# Patient Record
Sex: Female | Born: 1995 | Race: White | Hispanic: No | Marital: Single | Smoking: Never smoker
Health system: Southern US, Community
[De-identification: ages and names within clinical notes are randomized; demographics above are authoritative.]

## PROBLEM LIST (undated history)

## (undated) DIAGNOSIS — L709 Acne, unspecified: Secondary | ICD-10-CM

## (undated) DIAGNOSIS — E348 Other specified endocrine disorders: Secondary | ICD-10-CM

## (undated) HISTORY — DX: Acne, unspecified: L70.9

## (undated) HISTORY — DX: Other specified endocrine disorders: E34.8

---
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
## 2014-02-12 ENCOUNTER — Telehealth: Payer: Self-pay | Admitting: Family Medicine

## 2014-02-12 NOTE — Telephone Encounter (Signed)
noted 

## 2014-02-12 NOTE — Telephone Encounter (Signed)
Patient Information:  Caller Name: Megan Andersen  Phone: [phone]  Patient: Megan Andersen, Megan Andersen  Gender: Female  DOB: [birth year]  Age: 18 Years  PCP: Megan Andersen, Megan Andersen [facility](Family Practice)  Pregnant: No  Office Follow Up:  Does the office need to follow up with this patient?: Yes  Instructions For The Office: Patient with constipation issues. Also eating issues.  No appt available. Last BM 4 days ago. Please review and advise. Currently on Miralax BID since 02/04/14  RN Note:  No appt available. Please contact Mother for assistance with being seen. Megan Andersen  [phone].  Mother [phone]  Symptoms  Reason For Call & Symptoms: Mother Megan Andersen is calling in about Megan Andersen. She is recently home from Megan Andersen. (1) Mother states Megan Andersen is constipated . she has been taking Miralax Bid since 02/04/14.   Last BM Sunday 02/08/14 "but not a good BM- hard and small". Drinking water. Hx of constipation but never been treated. +bloating.   (2)  Possible eating disorder . She has discussed with mother and they did an online test "to check to see if you have an eating diorder". No anorexic or bulimic. "Binge and withholding food, regimented.  She is on phentermine and metformin, with no weight loss.  Reviewed Health History In EMR: Yes  Reviewed Medications In EMR: Yes  Reviewed Allergies In EMR: Yes  Reviewed Surgeries / Procedures: Yes  Date of Onset of Symptoms: 02/04/2014  Treatments Tried: Miralax  Treatments Tried Worked: No OB / GYN:  LMP: Unknown  Guideline(s) Used:  Constipation  Disposition Per Guideline:   See Today in Office  Reason For Disposition Reached:   Last bowel movement (BM) > 4 days ago  Advice Given:  General Constipation Instructions:  Eat a high fiber diet.  Drink adequate liquids.  Exercise regularly (even a daily 15 minute walk!).  Liquids:  Drink 6-8 glasses of water a day (Caution: certain medical conditions require fluid restriction).  Prune juice is a natural laxative.  Avoid alcohol.  High Fiber Diet:  Try to eat fresh fruit and vegetables at each meal (peas, prunes, citrus, apples, beans, corn).  Eat more grain foods (bran flakes, bran muffins, graham crackers, oatmeal, brown rice, and whole wheat bread). Popcorn is a source of fiber.  Osmotic Laxatives:  Miralax (polyethylene glycol 3350): Miralax is an "osmotic" agent which means that it binds water and causes water to be retained within the stool. You can use this laxative to treat occasional constipation. Do not use for more than 2 weeks without approval from your doctor. Generally, Miralax produces a bowel movement in 1 to 3 days. Side effects include diarrhea (especially at higher doses). If you are pregnant, discuss with your doctor before using. Available in the [place].  Call Back If:  Constipation continues (i.e., less than 3 BMs / week or straining more than 25% of the time) after following care advice for constipation for 2 weeks  You become worse  Patient Will Follow Care Advice:  YES

## 2014-03-26 ENCOUNTER — Telehealth: Payer: Self-pay | Admitting: Gastroenterology

## 2014-03-26 NOTE — Telephone Encounter (Signed)
I spoke with her mother , Mel AlmondBeth Eangle CRNA at [employer], about helping to arrange new GI appt for constipation, abdominal pains.  Patty, Can you call her on her cell, for new GI appt with any provider in next 2-3 weeks.  Extender is Ok per mom, patient.  Thanks

## 2014-03-26 NOTE — Telephone Encounter (Signed)
04/16/14 830 am Willette ClusterPaula Guenther appt pt is aware

## 2014-04-16 ENCOUNTER — Encounter: Payer: Self-pay | Admitting: Nurse Practitioner

## 2014-04-16 ENCOUNTER — Ambulatory Visit (INDEPENDENT_AMBULATORY_CARE_PROVIDER_SITE_OTHER): Payer: 59 | Admitting: Nurse Practitioner

## 2014-04-16 ENCOUNTER — Telehealth: Payer: Self-pay | Admitting: Family Medicine

## 2014-04-16 VITALS — BP 106/64 | HR 76 | Ht 66.0 in | Wt 177.0 lb

## 2014-04-16 DIAGNOSIS — R635 Abnormal weight gain: Secondary | ICD-10-CM

## 2014-04-16 DIAGNOSIS — R5381 Other malaise: Secondary | ICD-10-CM

## 2014-04-16 DIAGNOSIS — K5900 Constipation, unspecified: Secondary | ICD-10-CM

## 2014-04-16 DIAGNOSIS — R5383 Other fatigue: Secondary | ICD-10-CM

## 2014-04-16 NOTE — Telephone Encounter (Signed)
Referral placed.

## 2014-04-16 NOTE — Patient Instructions (Signed)
Please purchase Miralax over the counter and take as directed twice daily.  If this does not work please call the office back and we can send Linzess in for you to try.

## 2014-04-16 NOTE — Telephone Encounter (Signed)
Pt's mom is requesting a referral to a endocrinologist for pt. Pt still having weight gain/fatigue.

## 2014-04-16 NOTE — Progress Notes (Signed)
HPI :  Patient is a 18 year old female, accompanied by her mother, here for evaluation of constipation. Problem started approximately 2 years ago. She defines constipation has decreased frequency of stool. Patient averages one bowel movement every 4-6 days and her stools are hard. No rectal bleeding. Patient tried twice daily MiraLax for several days, it did work after one week. After reading the package insert discouraging prolonged use of MiraLax patient discontinued it. She has now been taking 3 magnesium tablets daily. Magnesium worked well for the first 3 weeks that her bowels are slowed back down this week. Patient gets right lower quadrant pain if she eats nuts/seeds. Pain lasted 30 minutes to an hour, unrelieved with defecation.  Patient and mother significantly concerned about excessive weight gain. Patient has gained 35 pounds over the last year, the majority of the weight has been in the last 9 or so months. Patient was started on metformin in December for possible polycystic ovary disease. She was seen by gynecology in March she did not think PCOS was likely, metformin was discontinued. Patient has also tried phentermine which actually caused weight gain. Patient is very frustrated as continues to gain weight despite exercise and small portions. Apparently thyroid labs were normal.   Past Medical History  Diagnosis Date  . Acne   . Pineal gland cyst     Family History  Problem Relation Age of Onset  . Hypertension Father   . Idiopathic pulmonary fibrosis Maternal Grandmother   . Hyperlipidemia Maternal Grandfather   . Diabetes Maternal Grandfather   . Kidney disease Maternal Grandfather   . Cancer Paternal Grandmother   . Hypertension Paternal Grandmother   . Atrial fibrillation Paternal Grandmother   . Hypertension Paternal Grandfather    History  Substance Use Topics  . Smoking status: Never Smoker   . Smokeless tobacco: Not on file  . Alcohol Use: No   Current  Outpatient Prescriptions  Medication Sig Dispense Refill  . DIGESTIVE ENZYMES PO Take 1 capsule by mouth. daily      . Magnesium 400 MG CAPS Take 3 capsules by mouth. At bedtime      . Oreg-Peppermint-Thyme-Gldnseal (YEAST FORMULA PO) Take 5 capsules by mouth. Take 5 caps at bedtime once a week      . Probiotic Product (PROBIOTIC DAILY PO) Take 2 capsules by mouth. daily       No current facility-administered medications for this visit.   No Known Allergies   Review of Systems: All systems reviewed and negative except where noted in HPI.    Physical Exam: BP 106/64  Pulse 76  Ht 5\' 6"  (1.676 m)  Wt 177 lb (80.287 kg)  BMI 28.58 kg/m2  LMP 03/21/2014 Constitutional: Pleasant,well-developed, white female in no acute distress. HEENT: Normocephalic and atraumatic. Conjunctivae are normal. No scleral icterus. Neck supple.  Cardiovascular: Normal rate, regular rhythm.  Pulmonary/chest: Effort normal and breath sounds normal. No wheezing, rales or rhonchi. Abdominal: Soft, nondistended, nontender. Bowel sounds active throughout. There are no masses palpable. No hepatomegaly. Extremities: no edema Lymphadenopathy: No cervical adenopathy noted. Neurological: Alert and oriented to person place and time. Skin: Skin is warm and dry. No rashes noted. Psychiatric: Normal mood and affect. Behavior is normal.   ASSESSMENT AND PLAN:  56. 18 year old female with two year history of constipation described as decreased frequency of defecation and hard stools). She has no alarm features such as weight loss, rectal bleeding. MiraLax was helpful but patient discontinued it  after package insert cautioned about prolonged use. Long discussion with patient mother regarding treatment of constipation. Obviously we would rather patient not require medication on a daily basis but it sounds like she has tried diet, exercise and increase fluid intake without much success. I would rather her use daily MiraLax  then taking magnesium tablets or stimulants. Should MiraLax lose its efficacy patient will let us know at which time we may consider Linzess. Patient will also let us know if she has any rectal bleeding or other GI symptoms as this would require further workup.  2. unexplained, significant weight gain over the last 2 years. This is in the face of exercise and healthy food choices with portion control. Unfortunately I did not have a whole lot to offer patient. Thyroid studies reportedly normal though I suppose there could be some other endocrine component of weight gain.

## 2014-04-17 DIAGNOSIS — K5900 Constipation, unspecified: Secondary | ICD-10-CM | POA: Insufficient documentation

## 2014-04-18 NOTE — Progress Notes (Signed)
Reviewed and agree with management plan.  Metha Kolasa T. Ayen Viviano, MD FACG 

## 2014-04-27 ENCOUNTER — Encounter: Payer: 59 | Attending: Family Medicine | Admitting: Dietician

## 2014-04-27 ENCOUNTER — Encounter: Payer: Self-pay | Admitting: Dietician

## 2014-04-27 VITALS — Ht 65.0 in | Wt 182.0 lb

## 2014-04-27 DIAGNOSIS — Z713 Dietary counseling and surveillance: Secondary | ICD-10-CM | POA: Diagnosis not present

## 2014-04-27 DIAGNOSIS — E669 Obesity, unspecified: Secondary | ICD-10-CM | POA: Diagnosis present

## 2014-04-27 DIAGNOSIS — R635 Abnormal weight gain: Secondary | ICD-10-CM

## 2014-04-27 NOTE — Patient Instructions (Addendum)
Follow up Dr. Elvera LennoxGherghe. Continue following mindful eating practices. Try to stay positive about what your body is capable doing. Think about talking someone about anxiety/tension.

## 2014-04-27 NOTE — Progress Notes (Signed)
  Medical Nutrition Therapy:  Appt start time: 1500 end time:  1545.  Assessment:  Primary concerns today: Megan Andersen is here today since she is frustrated her weight has been "yo-yo-ing" for the past three years. Has been watching what she eats and exercising and weight goes between 150-180 lbs. States that she was always "heavy" and lost a bit of weight/grew taller in middle school. Starting noticing an increase in weight 3 years ago.   Has been checked for thyroid issues and PCOS, both of which have been ruled out so far. Going to see Dr. Elvera LennoxGherghe next week to check on hormones.   Trying to eat "clean", high protein, avoids carbs, and gluten. Avoids excess sugar.  [place] and living [place] during the summer. Had a kitchenette at school so was able to prepare a lot of her own but still did go to the dining hall. Was under a lot of stress in college and still has some "tension" but she is not sure why. Sleeps about 7-8 hours per night.  Has been at current weight since November. States that mom's side of the family has large muscular, legs, and "hippy". Mom was athletic and gained some weight in her 30's but was always in the normal range.   Rarely feels too hungry, overall satisfied by foods she eats. Trying to listen to her body and eat when she is hungry. Eats protein the size of the palm of her hand, doesn't limit vegetables, eats about 1 cup of fruit. She does not drink alcohol. Drinks a lot of water. Does not skip meals.  Has used Phentermine and lost weight in the past, though she said that it didn't work the last time she tried it. Has read a lot about mindful eating and tries to eat mindfully. Would feel comfortable at 150-155 lbs.   Preferred Learning Style:   No preference indicated   Learning Readiness:   Ready  MEDICATIONS: Magnesium occasionally - d/t constipation     DIETARY INTAKE:  Avoided foods include gluten, sugar, raw nuts, corn, soy.    24-hr recall:  B  ( AM): protein shake (spinach, chocolate protein powder, berries) or egg whites with Canadian bacon  Snk ( AM): none L ( PM): vegetables - pepper, cucumbers, tomatoes, celery, carrots with hummus and chicken breast or chicken sausage Snk ( PM): sometimes will have fruit (watermelon) apple with almond butter  D ( PM): salmon, chicken, or pork with green beans, broccoli, or asparagus  Snk ( PM): none Beverages: water or green tea  Usual physical activity: running 20-30 minutes 3 x week, strength training/pilates/yoga 3 x week   Estimated energy needs: 2000 calories 225 g carbohydrates 150 g protein 56 g fat  Progress Towards Goal(s):  In progress.   Nutritional Diagnosis:  Megan Andersen-3.3 Overweight/obesity As related to frequent stress and unknown reasons for weight gain.  As evidenced by BMI of 30.3.    Intervention:  Nutrition counseling provided. Plan: Follow up Dr. Elvera LennoxGherghe. Continue following mindful eating practices. Try to stay positive about what your body is capable doing. Think about talking someone about anxiety/tension.   Teaching Method Utilized:  Visual Auditory Hands on  Barriers to learning/adherence to lifestyle change: none  Demonstrated degree of understanding via:  Teach Back   Monitoring/Evaluation:  Dietary intake, exercise, and body weight in 1-2 week(s).

## 2014-05-04 ENCOUNTER — Encounter: Payer: Self-pay | Admitting: Internal Medicine

## 2014-05-04 ENCOUNTER — Ambulatory Visit (INDEPENDENT_AMBULATORY_CARE_PROVIDER_SITE_OTHER): Payer: 59 | Admitting: Internal Medicine

## 2014-05-04 VITALS — BP 112/62 | HR 89 | Temp 98.4°F | Resp 12 | Ht 65.25 in | Wt 182.0 lb

## 2014-05-04 DIAGNOSIS — R635 Abnormal weight gain: Secondary | ICD-10-CM

## 2014-05-04 DIAGNOSIS — N926 Irregular menstruation, unspecified: Secondary | ICD-10-CM

## 2014-05-04 NOTE — Patient Instructions (Signed)
Please come back in the 3rd day of your menses for labs. If you can, come at 8 am, fasting. We will schedule a new appt if the labs are abnormal.

## 2014-05-04 NOTE — Progress Notes (Signed)
Patient ID: Megan Andersen, female   DOB: 1996/04/09, 18 y.o.   MRN: [id number]  HPI: Megan Andersen is a 18 y.o. female, referred by Dr Tawanna Coolerodd, in consultation for weight gain.   Pt describes she gained 40 lbs. Her weight gain started in 2012 >> started dieting and exercise >> lost some weight >> then gained it back. She feels she gained 30 lbs since last year, w/o changing  exercise and diet but she started college then.  She was previously tested for PCOS and thyroid ds and these were negative.  Weight gain: - no steroid use - Meals: - Breakfast: protein shake  - plant-based protein, spinach, berries - Lunch: meat + salad or veggies; sometimes humus; sometimes sweet potatoes - Dinner: same as lunch - Snacks: veggies + humus, fruit, almond butter - Diets tried: fast metabolism diet - for 1.5 years: eating whole foods, no corn, no soy, no gluten, avoids sugars; high protein, high veggies, sometimes fruit. Drinks water and green tea. - she tried phentermine: started Spring 2014 >> lost 20 lbs on it; and Spring 2015 >> gained 10 lbs on it! - Exercise: running - 3-4x a week, walks x 1 h every day with her dogs; was doing strength training  Fertility/Menstrual cycles: - had a h/o irregular menses; sometimes they are late; last year and the year before - had 3 mo w/o a cycle; after doing Atkins diet  - were taking OCPs >> gained 10 lbs >> 5 mo - overall she did not like being on OCP - she started NuvaRing >> mood swings >> 3 mo - she took it out - no h/o ovarian cysts - children: 0 - miscarriages: 0 - contraception: abstinence  Acne: - increased after puberty - tried Accutane 6 months >> cleared up   Hirsutism: - no  Treatments tried: - did not try Metformin - did not try Spironolactone - did not try [place] - not on OCPs  Other meds: - SSRIs: no  - Last thyroid tests: Lab Results  Component Value Date   TSH 0.79 09/18/2013   Pt has FH of obesity in uncle. FH of DM in MGF,  uncle. No h/o fertility pbs.   ROS: Constitutional: + weight gain, no fatigue, no subjective hyperthermia/hypothermia Eyes: no blurry vision, no xerophthalmia ENT: no sore throat, no nodules palpated in throat, no dysphagia/odynophagia, no hoarseness Cardiovascular: no CP/SOB/palpitations/leg swelling Respiratory: no cough/SOB Gastrointestinal: no N/V/D/C Musculoskeletal: no muscle/joint aches Skin: no acne, no hair on face Neurological: no tremors/numbness/tingling/dizziness Psychiatric: no depression/anxiety  Past Medical History  Diagnosis Date  . Acne   . Pineal gland cyst    History reviewed. No pertinent past surgical history. History   Social History  . Marital Status: Single    Spouse Name: N/A    Number of Children: 0   Occupational History  . student   Social History Main Topics  . Smoking status: Never Smoker   . Smokeless tobacco: Not on file  . Alcohol Use: No  . Drug Use: No   Current Outpatient Prescriptions on File Prior to Visit  Medication Sig Dispense Refill  . DIGESTIVE ENZYMES PO Take 1 capsule by mouth. daily      . Magnesium 400 MG CAPS Take 3 capsules by mouth. At bedtime      . Oreg-Peppermint-Thyme-Gldnseal (YEAST FORMULA PO) Take 5 capsules by mouth. Take 5 caps at bedtime once a week      . Probiotic Product (PROBIOTIC DAILY PO) Take 2 capsules  by mouth. daily       No current facility-administered medications on file prior to visit.   Allergies  Allergen Reactions  . Gluten Meal    Family History  Problem Relation Age of Onset  . Hypertension Father   . Idiopathic pulmonary fibrosis Maternal Grandmother   . Hyperlipidemia Maternal Grandfather   . Diabetes Maternal Grandfather   . Kidney disease Maternal Grandfather   . Cancer Paternal Grandmother   . Hypertension Paternal Grandmother   . Atrial fibrillation Paternal Grandmother   . Hypertension Paternal Grandfather    PE: BP 112/62  Pulse 89  Temp(Src) 98.4 F (36.9 C)  (Oral)  Resp 12  Ht 5' 5.25" (1.657 m)  Wt 182 lb (82.555 kg)  BMI 30.07 kg/m2  SpO2 99%  LMP 04/26/2014 Wt Readings from Last 3 Encounters:  05/04/14 182 lb (82.555 kg) (95%*, Z = 1.65)  04/27/14 182 lb (82.555 kg) (95%*, Z = 1.65)  04/16/14 177 lb (80.287 kg) (94%*, Z = 1.55)   * Growth percentiles are based on CDC 2-20 Years data.   Constitutional: overweight, in NAD, no full supraclavicular fat pads Eyes: PERRLA, EOMI, no exophthalmos ENT: moist mucous membranes, no thyromegaly, no cervical lymphadenopathy Cardiovascular: RRR, No MRG Respiratory: CTA B Gastrointestinal: abdomen soft, NT, ND, BS+ Musculoskeletal: no deformities, strength intact in all 4 Skin: moist, warm; no acne on face, no dark terminal hair on chin, no vellum on sideburns, no skin tags, no acanthosis nigricans, no purple, wide, stretch marks Neurological: no tremor with outstretched hands, DTR normal in all 4  ASSESSMENT: 1. Weight gain  2. Irregular menses  PLAN: 1. Weight gain - we discussed about endocrine causes for weight loss: I had a long discussion with the pt reviewing possible causes of weight loss (including endocrine): - Discussed diet in detail, reviewing each of her food choices and she appears to be eating healthy for >1 year - She is not drinking sodas - she eats a low glycemic index foods. Avoids highly-processed sugars.  - she is getting apparently adequate exercise daily - no meds that can increase her weight (steroids, etc) - we discussed about endocrine causes for obesity, to include:   hypothyroidism (thyroid test were normal 8 mo ago >> will recheck)  menopause (doubt, will check LH, FSH, estrogen)  PCOS (possibly)  Cushing disease (no features of Cushing sd. and she could actually lose 30 lbs recently) - since she is overweight and has a FH of DM, will check a HbA1c 1. And 2. I discussed with the patient about the fact that the PCOS is a misnomer, a patient does not  necessarily have to have polycystic ovaries to be diagnosed with the disorder. This is of sum of several conditions, including:  weight gain  insulin resistance (and therefore a higher risk of developing diabetes later in life)  acne  hirsutism  irregular menstrual cycles  decreased fertility. - We also discussed about the fact that the treatment is usually targeted to addressing the problem that concerns the patient the most: acne/hirsutism, weight gain, or fertility, but there is no single treatment for PCOS.  - The first-line therapy are oral contraceptives (she would like to avoid these). If she is concerned with her weight, we can use metformin (she agrees with this); if she is concerned about acne/hirsutism, we can add spironolactone (the acne resolved as mentioned in the HPI); and if she is concerned about fertility, I could refer her to reproductive endocrinology for possible  use of clomiphene (no plans for pregnancy now) - I will see her back if the labs are abnormal >> we will check: Orders Placed This Encounter  Procedures  . TSH  . T4, free  . T3, free  . LH  . FSH  . Estradiol  . Testosterone, free, total  . HgB A1c  She is now close to ovulation so she will return in the 3rd day of her menstrual cycle for labs.

## 2014-05-12 ENCOUNTER — Ambulatory Visit: Payer: 59 | Admitting: Dietician

## 2015-02-15 ENCOUNTER — Telehealth: Payer: Self-pay | Admitting: Family Medicine

## 2015-02-15 NOTE — Telephone Encounter (Signed)
error 

## 2015-04-16 ENCOUNTER — Encounter: Payer: 59 | Admitting: Adult Health

## 2015-07-21 IMAGING — US US PELVIS COMPLETE
1 series · 14 of 25 positions shown · non-contrast
Comparison: None.

CLINICAL DATA: Irregular menstrual cycle

EXAM:
TRANSABDOMINAL AND TRANSVAGINAL ULTRASOUND OF PELVIS
TECHNIQUE: Both transabdominal and transvaginal ultrasound examinations of the
pelvis were performed. Transabdominal technique was performed for
global imaging of the pelvis including uterus, ovaries, adnexal
regions, and pelvic cul-de-sac. It was necessary to proceed with
endovaginal exam following the transabdominal exam to visualize the
ovaries and endometrium .

[Series 1: us pelvis complete · 0.24mm/px · 14 of 63 slices shown]
[im 1/63]
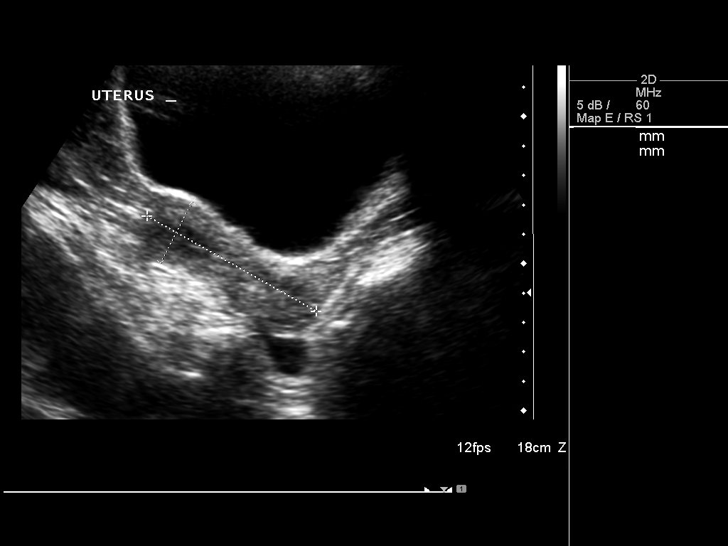
[im 6/63]
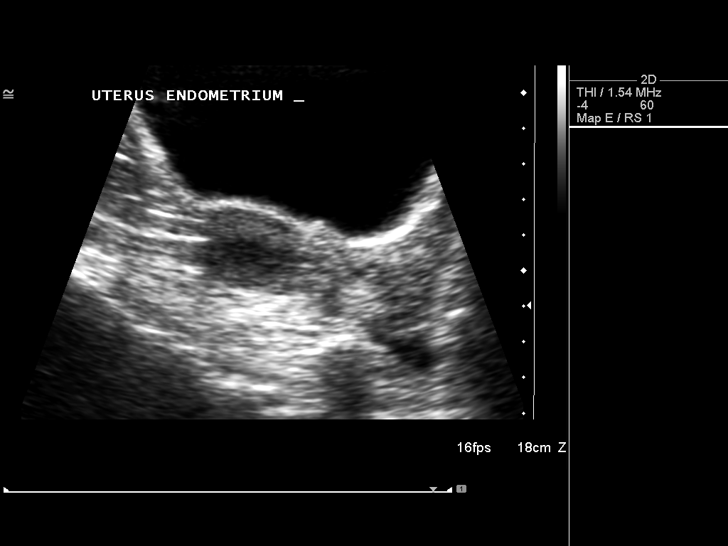
[im 11/63]
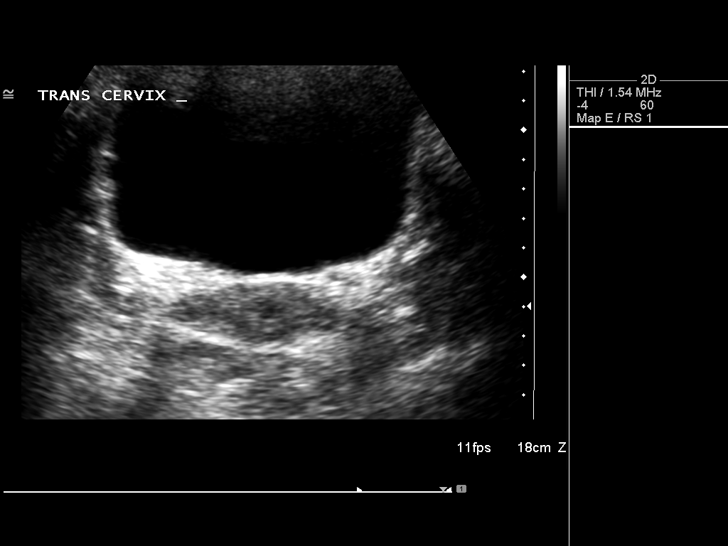
[im 16/63]
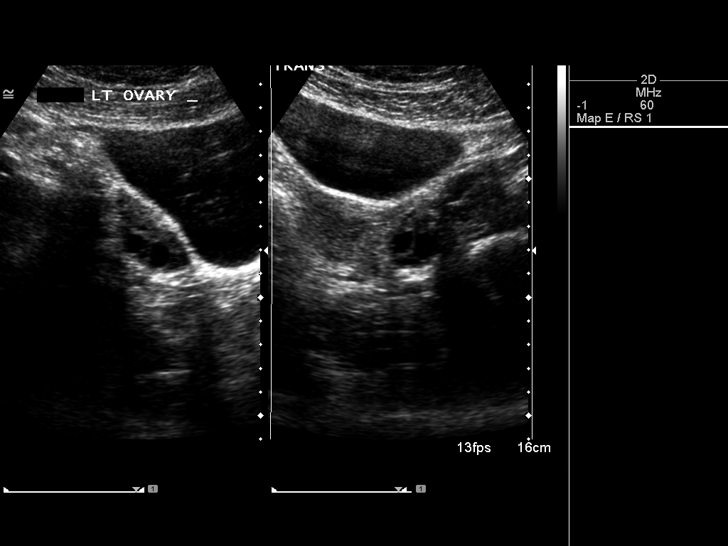
[im 21/63]
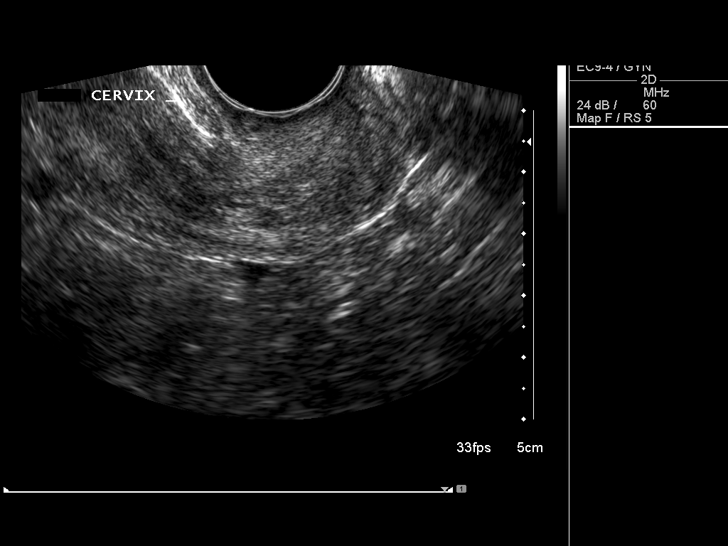
[im 24/63]
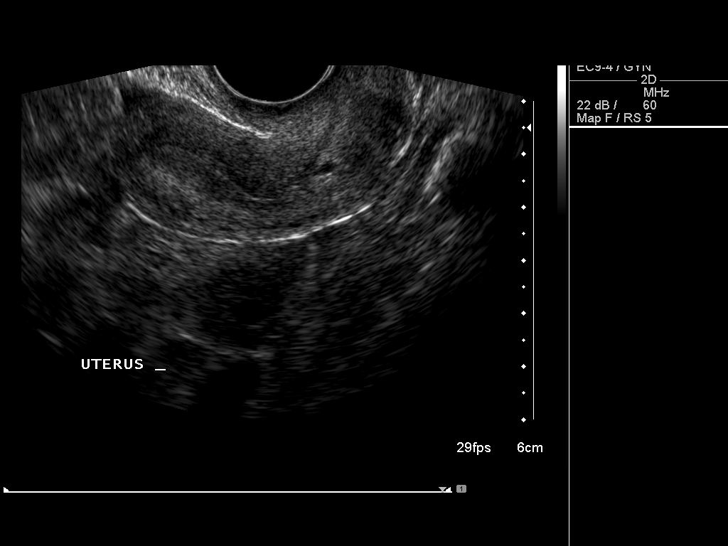
[im 29/63]
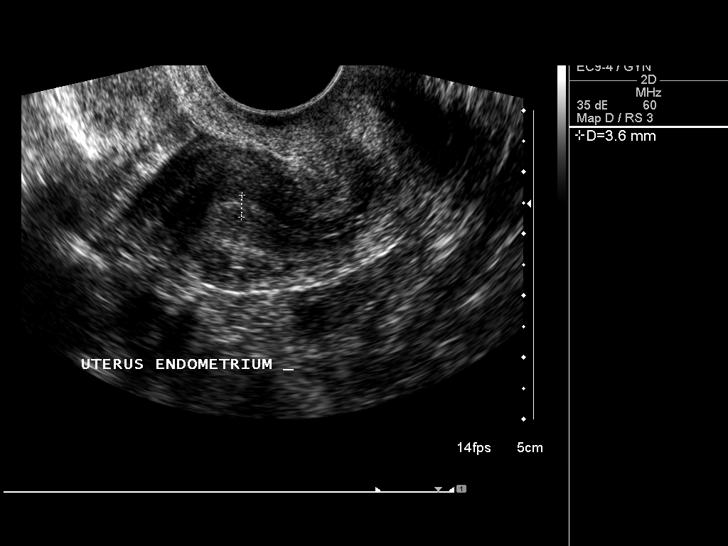
[im 34/63]
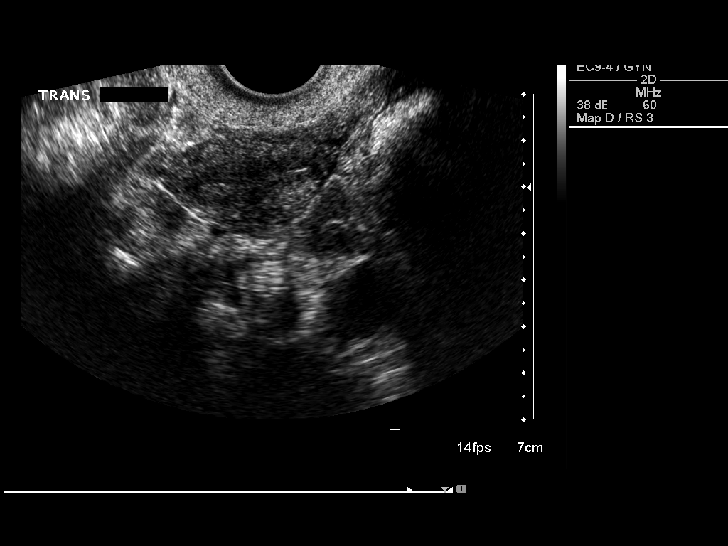
[im 39/63]
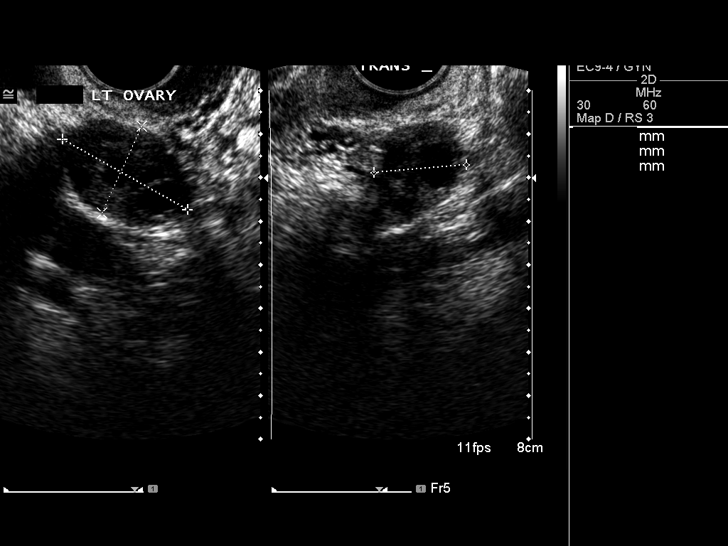
[im 42/63]
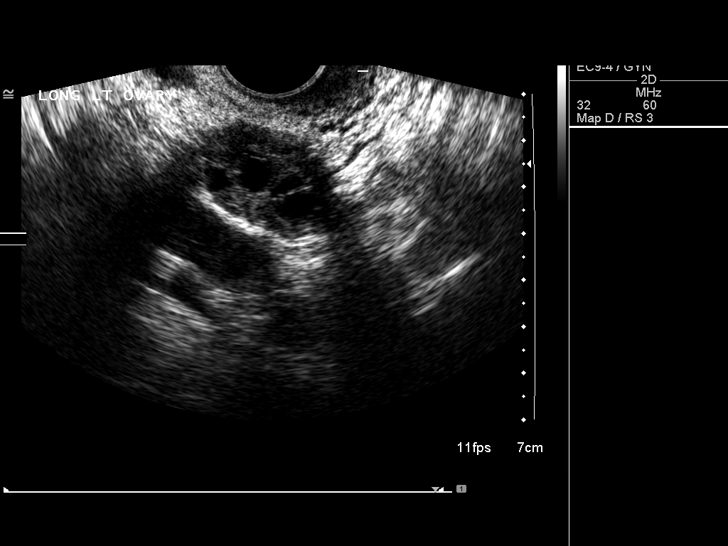
[im 47/63]
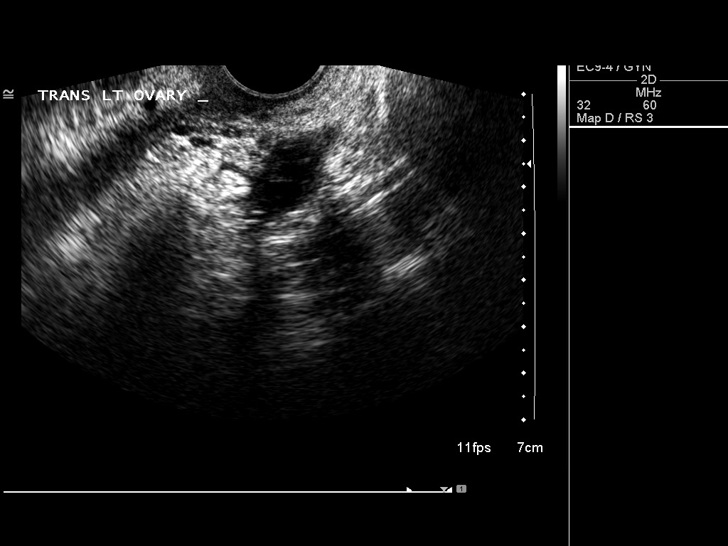
[im 52/63]
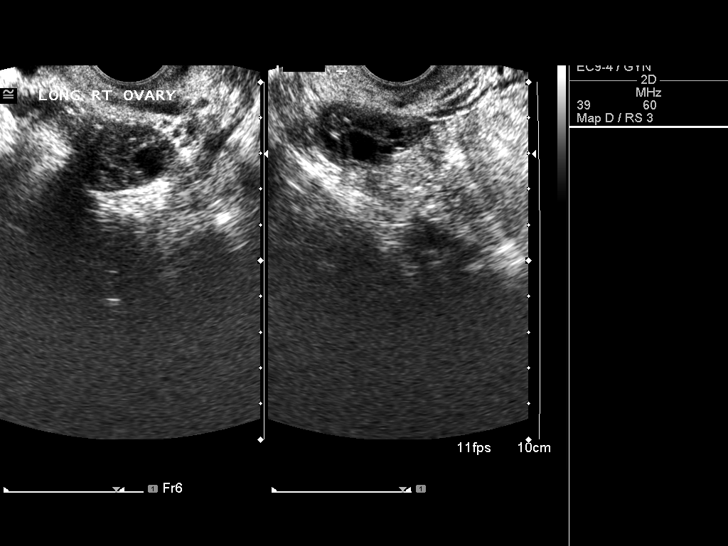
[im 57/63]
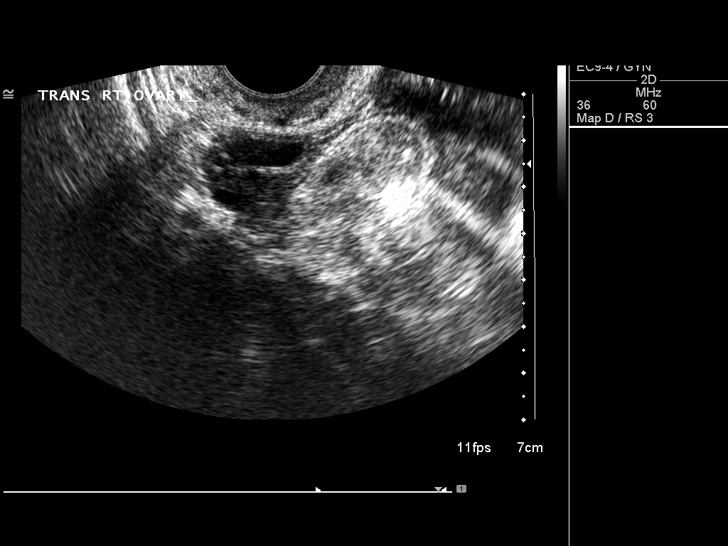
[im 63/63]
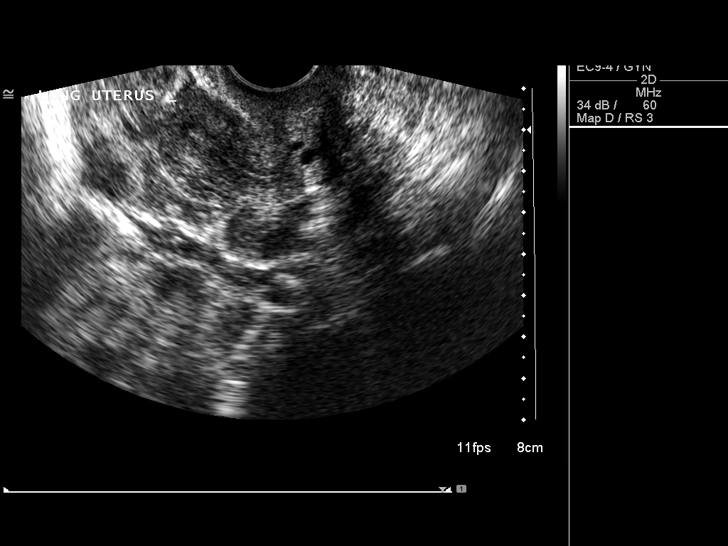

[14 of 25 positions shown; findings below may reference images not displayed]

FINDINGS: Uterus

Measurements: 5.9 x 2.3 x 3.5 cm . No fibroids or other mass
visualized.

Endometrium

Thickness: 3.6 mm . No focal abnormality visualized.

Right ovary

Measurements: 3.0 x 2.1 x 3.2 cm. . Normal appearance/no adnexal
mass.

Left ovary

Measurements: 3.3 x 2.2 x 2.1 cm . Normal appearance/no adnexal
mass.

Other findings

Trace free fluid noted within the pelvis.
IMPRESSION: 1.  No abnormality to explain irregular menstrual cycle.

2. Trace free fluid which may be physiologic in a premenopausal
female.
